# Patient Record
Sex: Female | Born: 1975 | Race: White | Hispanic: No | Marital: Married | Smoking: Current some day smoker
Health system: Southern US, Community
[De-identification: ages and names within clinical notes are randomized; demographics above are authoritative.]

## PROBLEM LIST (undated history)

## (undated) DIAGNOSIS — F32A Depression, unspecified: Secondary | ICD-10-CM

## (undated) DIAGNOSIS — F419 Anxiety disorder, unspecified: Secondary | ICD-10-CM

## (undated) HISTORY — DX: Anxiety disorder, unspecified: F41.9

---

## 1998-05-24 ENCOUNTER — Other Ambulatory Visit: Admission: RE | Admit: 1998-05-24 | Discharge: 1998-05-24 | Payer: Self-pay | Admitting: Obstetrics and Gynecology

## 1999-03-24 ENCOUNTER — Other Ambulatory Visit: Admission: RE | Admit: 1999-03-24 | Discharge: 1999-03-24 | Payer: Self-pay | Admitting: Obstetrics and Gynecology

## 1999-04-13 ENCOUNTER — Observation Stay (HOSPITAL_COMMUNITY): Admission: RE | Admit: 1999-04-13 | Discharge: 1999-04-13 | Payer: Self-pay | Admitting: Obstetrics and Gynecology

## 1999-07-27 ENCOUNTER — Inpatient Hospital Stay (HOSPITAL_COMMUNITY): Admission: AD | Admit: 1999-07-27 | Discharge: 1999-07-27 | Payer: Self-pay | Admitting: Obstetrics and Gynecology

## 1999-08-31 ENCOUNTER — Inpatient Hospital Stay (HOSPITAL_COMMUNITY): Admission: AD | Admit: 1999-08-31 | Discharge: 1999-08-31 | Payer: Self-pay | Admitting: *Deleted

## 1999-09-10 ENCOUNTER — Inpatient Hospital Stay (HOSPITAL_COMMUNITY): Admission: AD | Admit: 1999-09-10 | Discharge: 1999-09-10 | Payer: Self-pay | Admitting: Obstetrics and Gynecology

## 1999-09-15 ENCOUNTER — Inpatient Hospital Stay (HOSPITAL_COMMUNITY): Admission: AD | Admit: 1999-09-15 | Discharge: 1999-09-18 | Payer: Self-pay | Admitting: Obstetrics and Gynecology

## 1999-09-15 ENCOUNTER — Encounter (INDEPENDENT_AMBULATORY_CARE_PROVIDER_SITE_OTHER): Payer: Self-pay

## 1999-09-19 ENCOUNTER — Encounter: Admission: RE | Admit: 1999-09-19 | Discharge: 1999-12-18 | Payer: Self-pay | Admitting: Obstetrics and Gynecology

## 1999-10-19 ENCOUNTER — Other Ambulatory Visit: Admission: RE | Admit: 1999-10-19 | Discharge: 1999-10-19 | Payer: Self-pay | Admitting: Obstetrics and Gynecology

## 2002-04-07 ENCOUNTER — Other Ambulatory Visit: Admission: RE | Admit: 2002-04-07 | Discharge: 2002-04-07 | Payer: Self-pay | Admitting: Obstetrics and Gynecology

## 2002-04-30 ENCOUNTER — Ambulatory Visit (HOSPITAL_COMMUNITY): Admission: RE | Admit: 2002-04-30 | Discharge: 2002-04-30 | Payer: Self-pay | Admitting: Obstetrics and Gynecology

## 2002-05-01 ENCOUNTER — Inpatient Hospital Stay (HOSPITAL_COMMUNITY): Admission: AD | Admit: 2002-05-01 | Discharge: 2002-05-01 | Payer: Self-pay | Admitting: Family Medicine

## 2002-10-09 ENCOUNTER — Inpatient Hospital Stay (HOSPITAL_COMMUNITY): Admission: AD | Admit: 2002-10-09 | Discharge: 2002-10-09 | Payer: Self-pay | Admitting: Obstetrics and Gynecology

## 2002-10-10 ENCOUNTER — Encounter (INDEPENDENT_AMBULATORY_CARE_PROVIDER_SITE_OTHER): Payer: Self-pay | Admitting: Specialist

## 2002-10-10 ENCOUNTER — Inpatient Hospital Stay (HOSPITAL_COMMUNITY): Admission: AD | Admit: 2002-10-10 | Discharge: 2002-10-13 | Payer: Self-pay | Admitting: Obstetrics and Gynecology

## 2002-11-18 ENCOUNTER — Other Ambulatory Visit: Admission: RE | Admit: 2002-11-18 | Discharge: 2002-11-18 | Payer: Self-pay | Admitting: Obstetrics and Gynecology

## 2003-08-17 ENCOUNTER — Ambulatory Visit (HOSPITAL_COMMUNITY): Admission: RE | Admit: 2003-08-17 | Discharge: 2003-08-17 | Payer: Self-pay | Admitting: *Deleted

## 2003-08-17 ENCOUNTER — Encounter: Payer: Self-pay | Admitting: *Deleted

## 2005-12-22 ENCOUNTER — Other Ambulatory Visit: Admission: RE | Admit: 2005-12-22 | Discharge: 2005-12-22 | Payer: Self-pay | Admitting: Obstetrics and Gynecology

## 2010-11-14 ENCOUNTER — Observation Stay (HOSPITAL_COMMUNITY)
Admission: AD | Admit: 2010-11-14 | Discharge: 2010-11-16 | Payer: Self-pay | Source: Home / Self Care | Attending: Obstetrics and Gynecology | Admitting: Obstetrics and Gynecology

## 2010-12-29 ENCOUNTER — Ambulatory Visit (HOSPITAL_COMMUNITY): Admission: RE | Admit: 2010-12-29 | Payer: Self-pay | Admitting: Obstetrics and Gynecology

## 2011-02-13 LAB — URINE MICROSCOPIC-ADD ON

## 2011-02-13 LAB — CBC
Hemoglobin: 8.8 g/dL — ABNORMAL LOW (ref 12.0–15.0)
Hemoglobin: 9.9 g/dL — ABNORMAL LOW (ref 12.0–15.0)
MCH: 30.3 pg (ref 26.0–34.0)
MCH: 30.6 pg (ref 26.0–34.0)
MCHC: 34.2 g/dL (ref 30.0–36.0)
Platelets: 193 10*3/uL (ref 150–400)
Platelets: 194 10*3/uL (ref 150–400)
RBC: 3.21 MIL/uL — ABNORMAL LOW (ref 3.87–5.11)
RBC: 3.23 MIL/uL — ABNORMAL LOW (ref 3.87–5.11)
RDW: 12.3 % (ref 11.5–15.5)
WBC: 7.3 10*3/uL (ref 4.0–10.5)
WBC: 8.4 10*3/uL (ref 4.0–10.5)

## 2011-02-13 LAB — COMPREHENSIVE METABOLIC PANEL
ALT: 22 U/L (ref 0–35)
ALT: 23 U/L (ref 0–35)
AST: 22 U/L (ref 0–37)
AST: 25 U/L (ref 0–37)
AST: 27 U/L (ref 0–37)
Albumin: 2.9 g/dL — ABNORMAL LOW (ref 3.5–5.2)
Albumin: 3 g/dL — ABNORMAL LOW (ref 3.5–5.2)
Alkaline Phosphatase: 38 U/L — ABNORMAL LOW (ref 39–117)
Alkaline Phosphatase: 39 U/L (ref 39–117)
CO2: 28 mEq/L (ref 19–32)
CO2: 29 mEq/L (ref 19–32)
Calcium: 8 mg/dL — ABNORMAL LOW (ref 8.4–10.5)
Chloride: 101 mEq/L (ref 96–112)
Chloride: 104 mEq/L (ref 96–112)
Creatinine, Ser: 1.05 mg/dL (ref 0.4–1.2)
GFR calc Af Amer: 58 mL/min — ABNORMAL LOW (ref >60)
GFR calc Af Amer: >60 mL/min (ref >60)
GFR calc Af Amer: >60 mL/min (ref >60)
GFR calc non Af Amer: 48 mL/min — ABNORMAL LOW (ref >60)
GFR calc non Af Amer: 60 mL/min — ABNORMAL LOW (ref >60)
Potassium: 4.2 mEq/L (ref 3.5–5.1)
Potassium: 4.4 mEq/L (ref 3.5–5.1)
Sodium: 139 mEq/L (ref 135–145)
Sodium: 141 mEq/L (ref 135–145)
Total Bilirubin: 0.1 mg/dL — ABNORMAL LOW (ref 0.3–1.2)
Total Bilirubin: 0.3 mg/dL (ref 0.3–1.2)
Total Protein: 5.1 g/dL — ABNORMAL LOW (ref 6.0–8.3)
Total Protein: 6 g/dL (ref 6.0–8.3)

## 2011-02-13 LAB — URINALYSIS, ROUTINE W REFLEX MICROSCOPIC
Bilirubin Urine: NEGATIVE
Ketones, ur: 15 mg/dL — AB
Nitrite: NEGATIVE
pH: 7.5 (ref 5.0–8.0)

## 2011-02-13 LAB — DIFFERENTIAL
Basophils Absolute: 0 10*3/uL (ref 0.0–0.1)
Eosinophils Relative: 2 % (ref 0–5)
Lymphocytes Relative: 21 % (ref 12–46)
Monocytes Absolute: 0.6 10*3/uL (ref 0.1–1.0)
Monocytes Relative: 8 % (ref 3–12)

## 2011-02-13 LAB — URINE CULTURE
Colony Count: NO GROWTH
Culture: NO GROWTH

## 2011-04-21 NOTE — Discharge Summary (Signed)
Maria Bean, Maria NO.:  [id number]   MEDICAL RECORD NO.:  [id number]                    PATIENT TYPE:   LOCATION:                                       FACILITY:   PHYSICIAN:  Freddy Finner, M.D.                DATE OF BIRTH:   DATE OF ADMISSION:  10/10/2002  DATE OF DISCHARGE:  10/13/2002                                 DISCHARGE SUMMARY   ADMISSION DIAGNOSES:  1. Intrauterine pregnancy at 37+ weeks estimated gestational age.  2. Spontaneous rupture of membranes.  3. History of positive group B beta strep.  4. Failure to descend.  5. Multiparity, desires sterility.   DISCHARGE DIAGNOSES:  1. Status post Cesarean delivery.  2. Viable female infant.  3. Permanent sterilization.   PROCEDURE:  Primary low transverse Cesarean section and bilateral tubal  ligation.   REASON FOR ADMISSION:  Please see dictated history and physical.   HISTORY OF PRESENT ILLNESS:  The patient is a 35 year old Gravida III, Para  0, 2/0/1, who presented at [facility] at 37 weeks estimated  gestational age with spontaneous rupture of membranes. The patient had  history of positive group B beta strep. Antibiotics were administered for  prophylaxis. The patient did progress to full dilation. After pushing times  two hours with vertex remaining in minus two station, the decision was made  to proceed with a Cesarean delivery.   HOSPITAL COURSE:  The patient was taken to the OR where spinal anesthesia  was administered without difficulty. A low transverse incision was made with  the delivery of a viable female infant weighing 7 pounds, 6 ounces with  Apgar's of 8 at one minute and 9 at five minutes. Umbilical artery pH was  7.20. Bilateral tubal ligation was performed at the patient's request. The  patient tolerated the procedure well and was taken to the recovery room  in  stable condition. On postoperative day one, the patient had good return of  bowel function. Abdomen was soft. Fundus was firm and nontender. Abdominal  dressing was removed and incision was noted to be clean, dry, and intact.  Labs revealed a hemoglobin 9.3, platelet count 213,000. WBC count 14.5. On  postoperative day two, temperature was noted to be elevated to 101 degrees.  Fundus was firm but with marked tenderness noted. IV antibiotics were  administered. The patient was tolerating a regular diet without complaints  of nausea and vomiting. On postoperative day three, the patient was  afebrile. Slight erythema was noted superior to the incision on the right  margin. Staples were removed. Repeat labs revealed hemoglobin of 8.9,  platelet count of 225,000 and WBC count of 16.0. The patient was discharged  to home.   CONDITION ON DISCHARGE:  Good.   DIET:  Regular as tolerated.   ACTIVITY:  No heavy lifting. No driving times two weeks.  No vaginal entry.   FOLLOW UP:  The patient is to follow-up in the office in one to two weeks  for incision check. She is to call for temperature greater than 100 degrees,  persistent nausea and vomiting, heavy vaginal bleeding and/or redness or  drainage in the incisional site.   DISCHARGE MEDICATIONS:  1. Percocet 5/325 #30, one by mouth every four to six hours as needed pain.  2. Augmentin 500 mg, #2, to take one by mouth three times a day.  3. Niferex 150 mg one by mouth daily.  4. Prenatal vitamins one by mouth daily.  5. Motrin 600 mg over-the-counter every six hours as needed.     Julio Sicks, N.P.                        Freddy Finner, M.D.    CC/MEDQ  D:  11/13/2002  T:  11/13/2002  Job:  [id number]   cc:   Freddy Finner, M.D.  [address] [place]  [place] [address]  Fax: [phone]

## 2011-04-21 NOTE — Op Note (Signed)
NAMEMarland Bean  Maria, Bean                            ACCOUNT NO.:  [id number]   MEDICAL RECORD NO.:  [id number]                   PATIENT TYPE:  INP   LOCATION:  9126                                 FACILITY:  [facility]   PHYSICIAN:  Juluis Mire, M.D.                DATE OF BIRTH:  Jan 23, 1976   DATE OF PROCEDURE:  10/10/2002  DATE OF DISCHARGE:                                 OPERATIVE REPORT   PREOPERATIVE DIAGNOSES:  1. Intrauterine pregnancy at 37+ weeks.  2. Failure to descend.  3. Multiparity, desires sterility.   POSTOPERATIVE DIAGNOSES:  1. Intrauterine pregnancy at 37+ weeks.  2. Failure to descend.  3. Multiparity, desires sterility.   PROCEDURES:  1. Low transverse cesarean section.  2. Bilateral tubal ligation.   SURGEON:  Juluis Mire, M.D.   ANESTHESIA:  Spinal.   ESTIMATED BLOOD LOSS:  800 cc.   PACKS AND DRAINS:  None.   BLOOD REPLACED:  None.   COMPLICATIONS:  None.   INDICATION FOR PROCEDURE:  A 35 year old gravida 3, para 0-2-0-1, married  white female white female, who presents at 37-4/7 weeks with spontaneous  rupture of membranes.  She had a history of previous cervical cerclage that  was removed approximately a week ago.  Was positive group B strep.  Her past  history is significant in that with the last delivery she had a vacuum  extractor-assisted vaginal delivery of a 7 pound 9 ounce infant.  There was  a fairly significant shoulder dystocia, resulting in a fracture of a  clavicle and subsequent stay in the neonatal intensive care unit.  The  patient had presented with fairly advanced cervical dilatation.  Pushed well  for two hours, and the fetal vertex remained at rest at a -2 station with  increase in caput but actually no descent with pushing.  The decision was to  proceed with a cesarean section as opposed to attempt at operative vaginal  delivery in view of her past history.  The risks of surgery were discussed,  including the risk of  infection, the risk of hemorrhage that could  necessitate transfusion with the risk of HIV or hepatitis, the risk of  adjacent organs including bladder, bowel, or ureters that could require  further exploratory surgery, the risk of deep venous thrombosis and  pulmonary embolus.  The patient expressed an understanding of indications  and risks.   DESCRIPTION OF PROCEDURE:  The patient was taken in the OR and placed in the  supine position with a left lateral tilt.  After a satisfactory level of  spinal anesthesia was obtained, the abdomen was prepped out with Betadine  and draped as a sterile field.  A low transverse skin incision was made with  a knife and carried through subcutaneous tissue.  The anterior rectus fascia  was entered sharply and the incision in the fascia extended laterally.  The  fascia was taken off the muscle superiorly inferiorly.  The rectus muscles  were separated in the midline.  The anterior peritoneum was entered sharply  and the incision in the peritoneum extended both superiorly and inferiorly.  A low transverse bladder flap was developed.  A low transverse uterine  incision was begun with a knife and extended laterally using manual  traction.  The infant was in the vertex presentation, was delivered with  elevation and fundal pressure.  The infant was a viable female who weighed 7  pounds 6 ounces,  Apgars were 8/9, umbilical artery pH was 7.20.  There was  marked molding of the vertex, and it was in the OP presentation.  The  placenta was then delivered manually, the uterus was wiped clean of  membranes and placenta.  There was a slight inferior extension on the left  side of the uterine incision.  The uterine incision was closed with running  suture of 0 chromic using a two-layered closure technique.  The area of  extension on the left side did have some bleeding, brought under control  with interrupted figure-of-eights of 3-0 chromic.  At this point in time  we  had good hemostasis.  The patient was desirous of permanent sterilization.  The uterus had been exteriorized.  The tubes and ovaries were unremarkable.  The midsegment of each tube was grasped with Babcock tenaculum.  A hole was  made in the avascular mesosalpinx.  Individual ligatures of 0 plain catgut  were used to ligate off a segment of tube.  The intervening segment of tube  was then excised.  The cut ends of the tubes were cauterized using the  Bovie.  We had good hemostasis and complete transection of both tubes.  Revisualization of the lower uterine segment revealed some continued oozing,  brought under control with further figure-of-eights of 3-0 chromic.  However, the urine output remained clear and adequate.  The extension did  not go low enough to be concerned about the ureter at this point.  The  uterus was returned to the abdominal cavity.  We had good hemostasis.  We  thoroughly irrigated the pelvis.  Hemostasis again was excellent.  The  muscles were reapproximated with a running suture of 3-0 Vicryl, fascia  closed with a running suture of 0 PDS, skin was closed with staples and  Steri-Strips.  Sponge, instrument, and needle count were done by circulating  nurse x2.  Foley catheter remained clear at time of closure.  The patient  tolerated the procedure well, was returned to the recovery room in good  condition.                                                Juluis Mire, M.D.    JSM/MEDQ  D:  10/10/2002  T:  10/13/2002  Job:  [id number]

## 2011-04-21 NOTE — H&P (Signed)
[facility]  Patient:    Maria Bean, Maria Bean Visit Number: [id number] MRN: [id number]          Service Type: DSU Location: [facility] Attending Physician:  Trevor Iha Dictated by:   Trevor Iha, M.D. Admit Date:  04/30/2002                           History and Physical  HISTORY OF PRESENT ILLNESS:   Maria Bean is a 35 year old, G3, P2 with one living child who presents for placement of cervical cerclage. The patient has a history of incompetent cervix with a premature delivery at 22-23 weeks with the stillborn infant. 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 Her second pregnancy cervical cerclage was placed and the patient ultimately went into labor at 36 weeks and child is living and well. Her last menstrual period was January 18, 2002 given estimated date confinement on October 26, 2002. We had an ultrasound at 11 weeks which confirmed the date and normal intrauterine pregnancy with cervical length of 4 cm on ultrasound. 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  PAST MEDICAL HISTORY:         Negative.  PAST SURGICAL HISTORY:        Cerclage last pregnancy also D&C in 1996.  MEDICATIONS:                  1. Baby aspirin.                               2. Prenatal vitamins.  ALLERGIES:                    She has no known drug allergies.  LABORATORY DATA:              Blood type is B+, rubella immune, nonreactive VDRL. Hepatitis B is negative. Group B strep is positive.  PHYSICAL EXAMINATION:  VITAL SIGNS:                  Blood pressure is 124/68.  HEART:                        Regular rate and rhythm.  LUNGS:                        Clear to auscultation bilaterally.  ABDOMEN:                       Nondistended, nontender.  PELVIC:                       10-12 week size. Cervix is closed, thick and high.  IMPRESSION:                   Intrauterine pregnancy at approximately 14 weeks. History of incompetent cervix status post cerclage last pregnancy with successful pregnancy.  PLAN:                         McDonald cervical cerclage.  The risks and benefits were discussed at length which include but are not limited to risk of infection, bleeding, damage to uterus, tubes and ovaries, bowel, bladder, risk of miscarriage, possibility of the cerclage not working. The patient does give her informed consent. Dictated by:   Trevor Iha, M.D. Attending Physician:  Trevor Iha DD:  04/29/02 TD:  04/29/02 Job: [id number]

## 2011-04-21 NOTE — Op Note (Signed)
[facility]  Patient:    Maria Bean, Maria Bean Visit Number: [id number] MRN: [id number]          Service Type: DSU Location: [facility] Attending Physician:  Trevor Iha Dictated by:   Trevor Iha, M.D. Proc. Date: 04/30/02 Admit Date:  04/30/2002                             Operative Report  PREOPERATIVE DIAGNOSES:       Intrauterine pregnancy at 14 weeks, history of incompetent cervix.  POSTOPERATIVE DIAGNOSES:      Intrauterine pregnancy at 14 weeks, history of incompetent cervix.  PROCEDURE:                    Abdominal cervical cerclage.  SURGEON:                      Trevor Iha, M.D.  ANESTHESIA:                   Spinal.  INDICATIONS:                  Ms. May is a 35 year old G3, P2 with one living child.  Had incompetent cervix with the first child after having adenocarcinoma of the cervix and extensive surgery on the cervix.  She had a cerclage placed last pregnancy which resulted in a near term delivery.  She is pregnant again with an ultrasound showing a viable fetus approximately 14 weeks size.  She presents today for cervical cerclage.  Risks and benefits were discussed at length.  Informed consent was obtained.  DESCRIPTION OF PROCEDURE:     After adequate analgesia, the patient placed in the dorsal lithotomy position.  She is sterilely prepped and draped.  The bladder was sterilely drained.  Weighted speculum was placed in the posterior portion of the vagina.  A ring forceps was used to grasp the anterior portion of the cervix.  A suture of 0 Prolene was placed at the vaginal cervical fold anteriorly.  It was placed at the 12 oclock position, removed at the 10 oclock position.  Reinserted at the 8 oclock position, removed at 6 oclock. Reinserted at 6 oclock, removed at 4 oclock.  Placed at 2 oclock, removed at 12 oclock position and was tied snugly.  Left approximately 0.5 cm in length.  Minimal bleeding was  encountered.  Good placement was noted. Cervical length on pelvic examination was approximately 1.5-2 cm in length, however, by ultrasound 4 cm in length.  The cerclage was placed without difficulty and was placed, again, at the folds of the vaginal mucosa and the cervix.  The patient received 1 g of Cefotetan preoperatively.  Sponge and instrument count was normal after the procedure.  Patient was stable on transfer to the recovery room.  DISPOSITION:                  Patient will be discharged home with bed rest for the next two to three days and follow up in the office next week.  She is sent home with a routine instruction sheet. Dictated by:   Trevor Iha, M.D. Attending Physician:  Trevor Iha DD:  04/30/02 TD:  05/01/02 Job: [phone] [id number]

## 2015-05-19 ENCOUNTER — Other Ambulatory Visit: Payer: Self-pay | Admitting: Obstetrics and Gynecology

## 2015-05-20 LAB — CYTOLOGY - PAP

## 2018-08-15 ENCOUNTER — Emergency Department (HOSPITAL_COMMUNITY)
Admission: EM | Admit: 2018-08-15 | Discharge: 2018-08-15 | Disposition: A | Discharge status: Home / Self Care | Payer: Managed Care, Other (non HMO) | Attending: Emergency Medicine | Admitting: Emergency Medicine

## 2018-08-15 ENCOUNTER — Other Ambulatory Visit: Payer: Self-pay

## 2018-08-15 ENCOUNTER — Encounter (HOSPITAL_COMMUNITY): Payer: Self-pay

## 2018-08-15 ENCOUNTER — Emergency Department (HOSPITAL_COMMUNITY): Payer: Managed Care, Other (non HMO)

## 2018-08-15 DIAGNOSIS — R410 Disorientation, unspecified: Secondary | ICD-10-CM | POA: Insufficient documentation

## 2018-08-15 DIAGNOSIS — F17210 Nicotine dependence, cigarettes, uncomplicated: Secondary | ICD-10-CM | POA: Insufficient documentation

## 2018-08-15 DIAGNOSIS — R413 Other amnesia: Secondary | ICD-10-CM | POA: Diagnosis not present

## 2018-08-15 DIAGNOSIS — R638 Other symptoms and signs concerning food and fluid intake: Secondary | ICD-10-CM | POA: Diagnosis not present

## 2018-08-15 LAB — DIFFERENTIAL
Abs Immature Granulocytes: 0 10*3/uL (ref 0.0–0.1)
Basophils Absolute: 0.1 10*3/uL (ref 0.0–0.1)
Basophils Relative: 1 %
EOS ABS: 0.1 10*3/uL (ref 0.0–0.7)
EOS PCT: 1 %
Immature Granulocytes: 0 %
Lymphocytes Relative: 28 %
Lymphs Abs: 3 10*3/uL (ref 0.7–4.0)
MONOS PCT: 5 %
Monocytes Absolute: 0.6 10*3/uL (ref 0.1–1.0)
Neutro Abs: 7 10*3/uL (ref 1.7–7.7)
Neutrophils Relative %: 65 %

## 2018-08-15 LAB — I-STAT CHEM 8, ED
BUN: 6 mg/dL (ref 6–20)
CALCIUM ION: 1.15 mmol/L (ref 1.15–1.40)
CREATININE: 0.8 mg/dL (ref 0.44–1.00)
Chloride: 104 mmol/L (ref 98–111)
GLUCOSE: 128 mg/dL — AB (ref 70–99)
HEMATOCRIT: 46 % (ref 36.0–46.0)
HEMOGLOBIN: 15.6 g/dL — AB (ref 12.0–15.0)
Potassium: 4.4 mmol/L (ref 3.5–5.1)
Sodium: 140 mmol/L (ref 135–145)
TCO2: 25 mmol/L (ref 22–32)

## 2018-08-15 LAB — PROTIME-INR
INR: 1.01
PROTHROMBIN TIME: 13.2 s (ref 11.4–15.2)

## 2018-08-15 LAB — COMPREHENSIVE METABOLIC PANEL
ALT: 13 U/L (ref 0–44)
AST: 18 U/L (ref 15–41)
Albumin: 4.2 g/dL (ref 3.5–5.0)
Alkaline Phosphatase: 62 U/L (ref 38–126)
Anion gap: 11 (ref 5–15)
BILIRUBIN TOTAL: 0.6 mg/dL (ref 0.3–1.2)
BUN: 6 mg/dL (ref 6–20)
CO2: 23 mmol/L (ref 22–32)
Calcium: 9.3 mg/dL (ref 8.9–10.3)
Chloride: 105 mmol/L (ref 98–111)
Creatinine, Ser: 0.87 mg/dL (ref 0.44–1.00)
GFR calc Af Amer: >60 mL/min (ref >60)
Glucose, Bld: 130 mg/dL — ABNORMAL HIGH (ref 70–99)
POTASSIUM: 4.4 mmol/L (ref 3.5–5.1)
Sodium: 139 mmol/L (ref 135–145)
TOTAL PROTEIN: 6.5 g/dL (ref 6.5–8.1)

## 2018-08-15 LAB — CBC
HCT: 47.1 % — ABNORMAL HIGH (ref 36.0–46.0)
Hemoglobin: 15.1 g/dL — ABNORMAL HIGH (ref 12.0–15.0)
MCH: 29.1 pg (ref 26.0–34.0)
MCHC: 32.1 g/dL (ref 30.0–36.0)
MCV: 90.8 fL (ref 78.0–100.0)
PLATELETS: 234 10*3/uL (ref 150–400)
RBC: 5.19 MIL/uL — ABNORMAL HIGH (ref 3.87–5.11)
RDW: 12.3 % (ref 11.5–15.5)
WBC: 10.8 10*3/uL — ABNORMAL HIGH (ref 4.0–10.5)

## 2018-08-15 LAB — I-STAT BETA HCG BLOOD, ED (MC, WL, AP ONLY): I-stat hCG, quantitative: <5 m[IU]/mL (ref <5)

## 2018-08-15 LAB — I-STAT TROPONIN, ED: TROPONIN I, POC: 0.02 ng/mL (ref 0.00–0.08)

## 2018-08-15 LAB — APTT: aPTT: 32 seconds (ref 24–36)

## 2018-08-15 NOTE — ED Provider Notes (Signed)
MSE was initiated and I personally evaluated the patient at  2:29 PM on August 15, 2018.  The patient appears stable at this time so that the remainder of the MSE may be completed by another provider.   Patient placed in Quick Look pathway, seen and evaluated   Chief Complaint: memory problems  HPI:   Patient is a 42 yo female with hx of tobacco abuse presents with complaint of memory issues. Yesterday was a normal day for her. Today she woke up she was checking work emails and realized she did not remember sending certain emails. She called her daughter and told her she did not feel well and subsequently forgot that conversation. She had some mild lightheadedness. At this time she is feeling better, feels memory has improved but she is scared. Denies numbness, weakness, dizziness like room spinning, or headache.   ROS: + for memory issues and lightheadedness. - for numbness, weakness, dizziness, headache  Physical Exam:   Gen: No distress  Neuro: Awake and Alert  Skin: Warm    Focused Exam: CN III-XII grossly intact. 5/5 symmetric grip strength. Sensation grossly intact x 4. Negative pronator drift. Negative romberg. Ambulatory with steady gait. Normal finger to nose.   Initiation of care has begun. The patient has been counseled on the process, plan, and necessity for staying for the completion/evaluation, and the remainder of the medical screening examination. I discussed with patient  and if present family/friends the importance of alerting staff to any new or worsening symptoms or any other concerns throughout his/her ER visit.     Cherly Andersonetrucelli, Samantha R, PA-C 08/15/18 1433    Arby BarrettePfeiffer, Marcy, MD 08/20/18 [phone]

## 2018-08-15 NOTE — Discharge Instructions (Addendum)
You appear mildly dehydrated.  Follow with Dr. Paulino RilyWolters for further evaluation and treatment.

## 2018-08-15 NOTE — ED Provider Notes (Signed)
[facility] EM[facility] Provider Note   CSN: [id number] Arrival date & time: 08/15/18  1410     History   Chief Complaint Chief Complaint  Patient presents with  . Dizziness  . Memory Loss    HPI Maria Bean is a 42 y.o. female.  HPI Patient presents with a history of memory loss.  Was working on computer day and had some difficulty remembering.  States there was 2 emails that she had sent that she did not remember sending.  Some mild confusion.  Now back to normal.  Discussed with patient and her husband.  Reportedly patient has some issues at times when she does not eat much.  She did not eat much before this episode and then ate some food before EMS arrived.  After began to feel better.  At times gets some headaches and has had a dull headache today.  Has had one for the last couple days.  Has had previous headaches where she will have vision changes that would go across her peripheral vision to the middle and then back again.  States that was not going on with this headache however.  No fevers.  No difficulty walking.  Status somewhat decreased oral intake.  Has lost weight but has been attempting to.  Has previously had her thyroid checked.  No localizing numbness weakness.  States last night her vision got a little blurry in both of her eyes. History reviewed. No pertinent past medical history.  There are no active problems to display for this patient.   History reviewed. No pertinent surgical history.   OB History   None      Home Medications    Prior to Admission medications   Not on File    Family History History reviewed. No pertinent family history.  Social History Social History   Tobacco Use  . Smoking status: Current Every Day Smoker    Packs/day: 0.50    Types: Cigarettes  Substance Use Topics  . Alcohol use: Never    Frequency: Never  . Drug use: Never     Allergies   Patient has no known  allergies.   Review of Systems Review of Systems  Constitutional: Positive for appetite change.  HENT: Negative for congestion.   Eyes: Positive for visual disturbance.  Respiratory: Negative for shortness of breath.   Cardiovascular: Negative for chest pain.  Gastrointestinal: Negative for abdominal pain.  Endocrine: Negative for polyuria.  Genitourinary: Negative for menstrual problem.  Musculoskeletal: Negative for arthralgias.  Skin: Negative for rash.  Neurological: Positive for headaches. Negative for speech difficulty and light-headedness.  Hematological: Negative for adenopathy.  Psychiatric/Behavioral: Negative for confusion.     Physical Exam Updated Vital Signs BP 122/76   Pulse 66   Temp 98.2 F (36.8 C) (Oral)   Resp 14   Ht 5\' 4"  (1.626 m)   Wt 77.1 kg   SpO2 96%   BMI 29.18 kg/m   Physical Exam  Constitutional: She is oriented to person, place, and time. She appears well-developed.  Eyes: Pupils are equal, round, and reactive to light. EOM are normal.  Neck: Neck supple.  Cardiovascular: Normal rate.  Pulmonary/Chest: Effort normal.  Abdominal: Soft.  Musculoskeletal: She exhibits no tenderness.  Neurological: She is alert and oriented to person, place, and time. No cranial nerve deficit.  Skin: Skin is warm. Capillary refill takes less than 2 seconds.  Psychiatric: She has a normal mood and affect.  ED Treatments / Results  Labs (all labs ordered are listed, but only abnormal results are displayed) Labs Reviewed  CBC - Abnormal; Notable for the following components:      Result Value   WBC 10.8 (*)    RBC 5.19 (*)    Hemoglobin 15.1 (*)    HCT 47.1 (*)    All other components within normal limits  COMPREHENSIVE METABOLIC PANEL - Abnormal; Notable for the following components:   Glucose, Bld 130 (*)    All other components within normal limits  I-STAT CHEM 8, ED - Abnormal; Notable for the following components:   Glucose, Bld 128 (*)     Hemoglobin 15.6 (*)    All other components within normal limits  PROTIME-INR  APTT  DIFFERENTIAL  I-STAT TROPONIN, ED  CBG MONITORING, ED  I-STAT BETA HCG BLOOD, ED ([facility], [facility], [facility] ONLY)    EKG EKG Interpretation  Date/Time:  Thursday August 15 2018 14:19:16 EDT Ventricular Rate:  92 PR Interval:  128 QRS Duration: 88 QT Interval:  362 QTC Calculation: 447 R Axis:   52 Text Interpretation:  Normal sinus rhythm Normal ECG Confirmed by Benjiman Core [phone]) on 08/15/2018 6:57:24 PM   Radiology Ct Head Wo Contrast  Result Date: 08/15/2018 CLINICAL DATA:  Dizziness. EXAM: CT HEAD WITHOUT CONTRAST TECHNIQUE: Contiguous axial images were obtained from the base of the skull through the vertex without intravenous contrast. COMPARISON:  None. FINDINGS: Brain: No evidence of acute infarction, hemorrhage, hydrocephalus, extra-axial collection or mass lesion/mass effect. Vascular: No hyperdense vessel or unexpected calcification. Skull: Normal. Negative for fracture or focal lesion. Sinuses/Orbits: No acute finding. Other: None. IMPRESSION: Normal head CT. Electronically Signed   By: Lupita Raider, M.D.   On: 08/15/2018 15:48    Procedures Procedures (including critical care time)  Medications Ordered in ED Medications - No data to display   Initial Impression / Assessment and Plan / ED Course  I have reviewed the triage vital signs and the nursing notes.  Pertinent labs & imaging results that were available during my care of the patient were reviewed by me and considered in my medical decision making (see chart for details).     Patient with memory loss.  Back at baseline now.  Husband thinks her blood sugar may have dropped.  However she ate in the time she was evaluated sugar had come up.  Doubt this is a transient global amnesia.  However complicated migraine possibility 2.  Has had some headaches on occasion will get headaches with some vision changes.  Has had her thyroid  level checked previously.  At this point she is back at her baseline and lab work is overall reassuring.  Hemoglobin mildly elevated may have some dehydration.  Will discharge home to follow with PCP as needed.  Final Clinical Impressions(s) / ED Diagnoses   Final diagnoses:  Memory loss    ED Discharge Orders    None       Benjiman Core, MD 08/15/18 1857

## 2018-08-15 NOTE — ED Triage Notes (Addendum)
Pt from home with complaint of dizziness, memory issues. Pt states "I was sitting at my computer and I was looking at emails that I had sent but I don't remember sending them" Pt also doesn't remember what she did while working this morning. VSS. No neuro deficits. Axox4 but takes a minute to answer some questions.

## 2018-08-21 ENCOUNTER — Emergency Department (HOSPITAL_COMMUNITY): Payer: Managed Care, Other (non HMO)

## 2018-08-21 ENCOUNTER — Encounter (HOSPITAL_COMMUNITY): Payer: Self-pay | Admitting: Emergency Medicine

## 2018-08-21 ENCOUNTER — Emergency Department (HOSPITAL_COMMUNITY)
Admission: EM | Admit: 2018-08-21 | Discharge: 2018-08-21 | Disposition: A | Discharge status: Home / Self Care | Payer: Managed Care, Other (non HMO) | Attending: Emergency Medicine | Admitting: Emergency Medicine

## 2018-08-21 ENCOUNTER — Other Ambulatory Visit: Payer: Self-pay

## 2018-08-21 DIAGNOSIS — F17210 Nicotine dependence, cigarettes, uncomplicated: Secondary | ICD-10-CM | POA: Diagnosis not present

## 2018-08-21 DIAGNOSIS — R42 Dizziness and giddiness: Secondary | ICD-10-CM

## 2018-08-21 DIAGNOSIS — H539 Unspecified visual disturbance: Secondary | ICD-10-CM | POA: Diagnosis not present

## 2018-08-21 NOTE — ED Notes (Signed)
The pt has had dizziness for approx one week  She has been here and to her regular doctor  She was told to see her eye doctor who sent her back to her regular doctor and then she wss told to return here to the ed.  At present she has a sl headach with nausea.  She has a history of migraine headaches

## 2018-08-21 NOTE — ED Notes (Signed)
The pt reports that  She lost vision in both her eyes x 2 today  No vision  Problem  At present family ay bedside

## 2018-08-21 NOTE — ED Notes (Signed)
[facility]

## 2018-08-21 NOTE — ED Notes (Signed)
Returned from [facility]  Blood work cancelled

## 2018-08-21 NOTE — Discharge Instructions (Addendum)
Please read instructions below. You can take 600 mg of Advil/ibuprofen every 6 hours as needed for headache. Schedule an appointment with your primary care provider to follow up on your visit today. Schedule an appointment with a neurologist for follow up on your intermittent symptoms. Return to the ER for severe headache, new vision changes, slurred speech, facial droop, weakness or numbness, or new or concerning symptoms.

## 2018-08-21 NOTE — ED Triage Notes (Signed)
Pt reports vision changes while in the drive thru today, states that she had 2 episodes of not being able to see with restoration of vision within a few seconds pt states that she hasn't had any issues with her vision since but now feels dizzy. Reports that she has been trying to quit smoking so she is wearing a nicotine patch and has had nausea with wearing the patch before. Pt is A&Ox4 with no neurological deficits and was seen last week for stroke like work up.

## 2018-08-21 NOTE — ED Provider Notes (Signed)
[facility] Provider Note   CSN: [id number] Arrival date & time: 08/21/18  1542     History   Chief Complaint Chief Complaint  Patient presents with  . Dizziness    HPI Maria Bean is a 42 y.o. female with past medical history of hyperlipidemia, depression, anxiety, presenting to the ED with complaint of intermittent and loss that occurred at 930 this morning.  Patient states she was sitting in the drive-through when she suddenly lost her vision.  She states this lasted a few seconds and then returned about 1 minute later.  Denies any symptoms since that time.  Denies headache, unilateral limb numbness or weakness, slurred speech, facial droop.  She was seen on 08/15/2018 with complaint of memory loss.  She had a normal work-up during that visit including negative head CT.  She states since that time she has felt intermittent dizziness and lightheadedness with some headache.  Reports history of migraine with aura though states her vision loss today is different from her previous aura.  The history is provided by the patient.    History reviewed. No pertinent past medical history.  There are no active problems to display for this patient.   History reviewed. No pertinent surgical history.   OB History   None      Home Medications    Prior to Admission medications   Not on File    Family History No family history on file.  Social History Social History   Tobacco Use  . Smoking status: Current Some Day Smoker    Packs/day: 0.10    Types: Cigarettes  . Smokeless tobacco: Never Used  Substance Use Topics  . Alcohol use: Never    Frequency: Never  . Drug use: Never     Allergies   Patient has no known allergies.   Review of Systems Review of Systems  Constitutional: Negative for fever.  Eyes: Positive for visual disturbance. Negative for photophobia.  Neurological: Positive for dizziness, light-headedness and  headaches. Negative for syncope, facial asymmetry, speech difficulty, weakness and numbness.  Psychiatric/Behavioral: Negative for confusion.  All other systems reviewed and are negative.    Physical Exam Updated Vital Signs BP 140/80   Pulse 74   Temp 98.7 F (37.1 C) (Oral)   Resp 18   Ht 5\' 4"  (1.626 m)   Wt 77.1 kg   SpO2 100%   BMI 29.18 kg/m   Physical Exam  Constitutional: She appears well-developed and well-nourished. No distress.  HENT:  Head: Normocephalic and atraumatic.  Eyes: Pupils are equal, round, and reactive to light. Conjunctivae and EOM are normal.  Neck: Normal range of motion. Neck supple.  Cardiovascular: Normal rate, regular rhythm, normal heart sounds and intact distal pulses.  Pulmonary/Chest: Effort normal and breath sounds normal. No respiratory distress.  Abdominal: Soft. Bowel sounds are normal.  Neurological: She is alert.  Mental Status:  Alert, oriented, thought content appropriate, able to give a coherent history. Speech fluent without evidence of aphasia. Able to follow 2 step commands without difficulty.  Cranial Nerves:  II:  Peripheral visual fields grossly normal, pupils equal, round, reactive to light III,IV, VI: ptosis not present, extra-ocular motions intact bilaterally, 1-2 beat nystagmus that extinguishes V,VII: smile symmetric, facial light touch sensation equal VIII: hearing grossly normal to voice  X: uvula elevates symmetrically  XI: bilateral shoulder shrug symmetric and strong XII: midline tongue extension without fassiculations Motor:  Normal tone. 5/5 in upper and lower  extremities bilaterally including strong and equal grip strength and dorsiflexion/plantar flexion Sensory: Pinprick and light touch normal in all extremities.  Deep Tendon Reflexes: 2+ and symmetric in the biceps and patella Cerebellar: normal finger-to-nose with bilateral upper extremities Gait: normal gait and balance CV: distal pulses palpable  throughout    Skin: Skin is warm.  Psychiatric: She has a normal mood and affect. Her behavior is normal.  Nursing note and vitals reviewed.    ED Treatments / Results  Labs (all labs ordered are listed, but only abnormal results are displayed) Labs Reviewed - No data to display  EKG EKG Interpretation  Date/Time:  Wednesday August 21 2018 16:05:53 EDT Ventricular Rate:  94 PR Interval:  134 QRS Duration: 82 QT Interval:  364 QTC Calculation: 455 R Axis:   68 Text Interpretation:  Normal sinus rhythm Normal ECG Confirmed by Benjiman Core [phone]) on 08/21/2018 7:48:22 PM   Radiology Mr Brain Wo Contrast (neuro Protocol)  Result Date: 08/21/2018 CLINICAL DATA:  Transient vision loss. EXAM: MRI HEAD WITHOUT CONTRAST TECHNIQUE: Multiplanar, multiecho pulse sequences of the brain and surrounding structures were obtained without intravenous contrast. COMPARISON:  Head CT 08/15/2018 FINDINGS: BRAIN: There is no acute infarct, acute hemorrhage or mass effect. The midline structures are normal. There are no old infarcts. The white matter signal is normal for the patient's age. The CSF spaces are normal for age, with no hydrocephalus. Susceptibility-sensitive sequences show no chronic microhemorrhage or superficial siderosis. VASCULAR: Major intracranial arterial and venous sinus flow voids are preserved. SKULL AND UPPER CERVICAL SPINE: The visualized skull base, calvarium, upper cervical spine and extracranial soft tissues are normal. SINUSES/ORBITS: No fluid levels or advanced mucosal thickening. No mastoid or middle ear effusion. The orbits are normal. IMPRESSION: Normal brain MRI. Electronically Signed   By: Deatra Keari Miu M.D.   On: 08/21/2018 20:47    Procedures Procedures (including critical care time)  Medications Ordered in ED Medications - No data to display   Initial Impression / Assessment and Plan / ED Course  I have reviewed the triage vital signs and the nursing  notes.  Pertinent labs & imaging results that were available during my care of the patient were reviewed by me and considered in my medical decision making (see chart for details).     Patient presenting to the ED with complaint of brief episodes of vision loss that occurred around 9:30 AM this morning.  Associated headache with some intermittent lightheadedness that is been ongoing.  No other neurologic symptoms.  On exam, no focal neuro deficits.  Given patient's recent visit with negative head CT and new symptoms, MRI was ordered to rule out acute infarct.  MRI is normal.  Suspect complicated migraine given patient's history of migraine with aura.  She is instructed to follow-up with neurology, referral provided.  Asymptomatic throughout ED stay.  Safe for discharge.  Patient discussed with Dr. Rubin Payor, who guided treatment and agrees with care plan for discharge with follow-up.  Discussed results, findings, treatment and follow up. Patient advised of return precautions. Patient verbalized understanding and agreed with plan.   Final Clinical Impressions(s) / ED Diagnoses   Final diagnoses:  Dizziness  Transient vision disturbance    ED Discharge Orders    None       Orman Matsumura, [place] N, PA-C 08/21/18 2118    Benjiman Core, MD 08/21/18 2229

## 2019-08-27 IMAGING — MR MR HEAD W/O CM
10 of 11 series · 43 of 48 positions shown · non-contrast
Comparison: Head CT 08/15/2018

CLINICAL DATA: Transient vision loss.

EXAM:
MRI HEAD WITHOUT CONTRAST
TECHNIQUE: Multiplanar, multiecho pulse sequences of the brain and surrounding
structures were obtained without intravenous contrast.

[Series 5: DWI · axial · 4.0mm · 0.88mm/px · z∈[-81,+57]mm · 9 of 74 slices shown (1 of 4)]
[im 1/74]
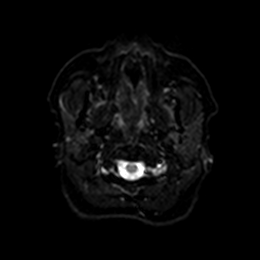
[im 10/74]
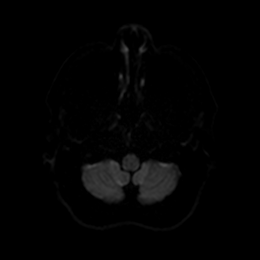
[im 19/74]
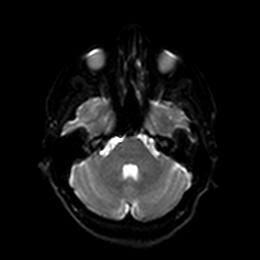
[im 28/74]
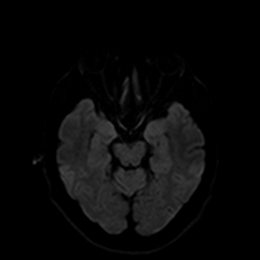
[im 37/74]
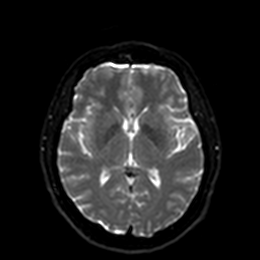
[im 46/74]
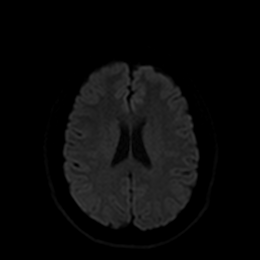
[im 55/74]
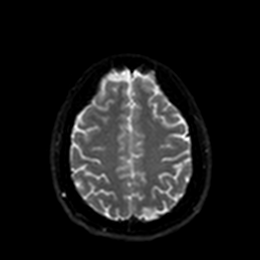
[im 64/74]
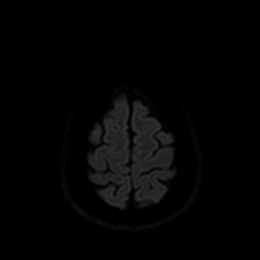
[im 74/74]
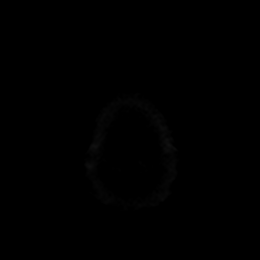

[Series 6: DWI · axial · 4.0mm · 0.88mm/px · z∈[-81,+57]mm · 4 of 37 slices shown (2 of 4)]
[im 1/37]
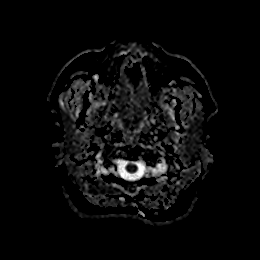
[im 13/37]
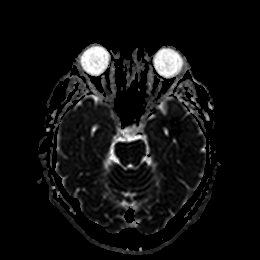
[im 25/37]
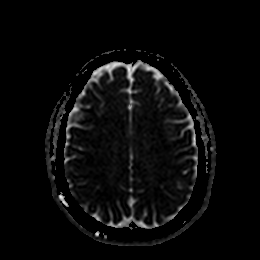
[im 37/37]
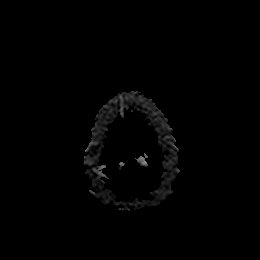

[Series 7: FLAIR · axial · 5.0mm · 0.45mm/px · z∈[-82,+57]mm · 3 of 25 slices shown]
[im 1/25]
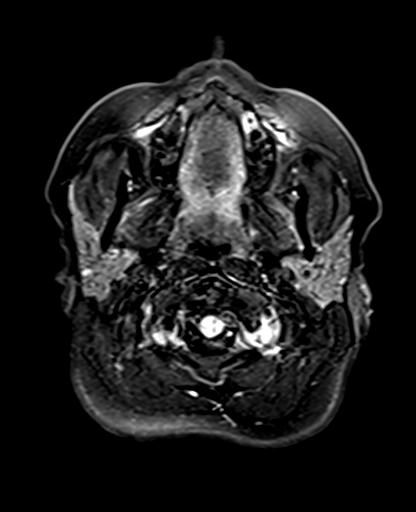
[im 13/25]
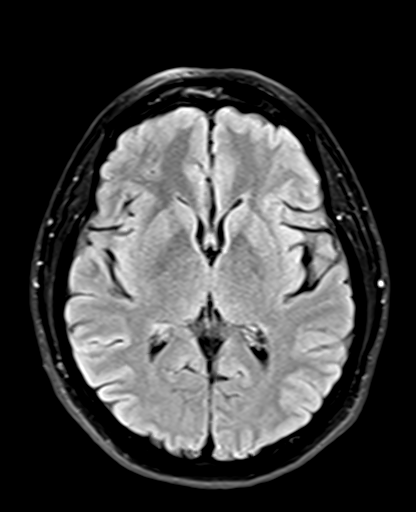
[im 25/25]
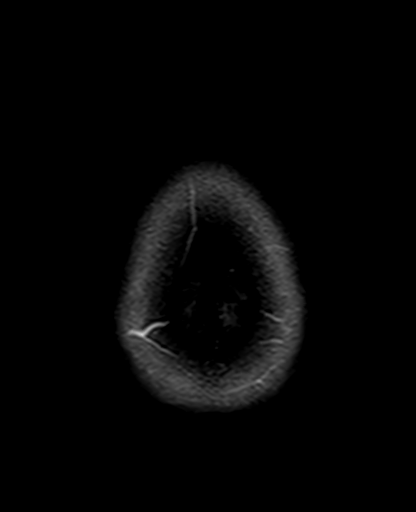

[Series 8: T2 · axial · 5.0mm · 0.72mm/px · z∈[-81,+57]mm · 3 of 25 slices shown (1 of 2)]
[im 1/25]
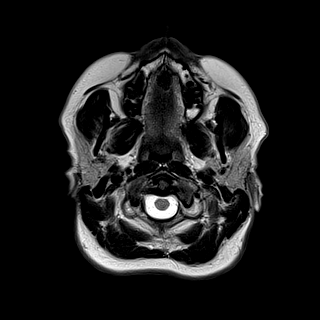
[im 13/25]
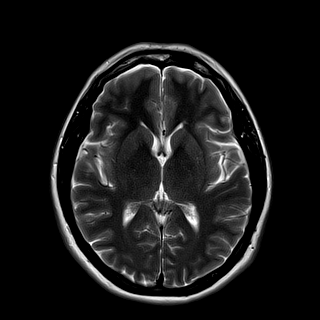
[im 25/25]
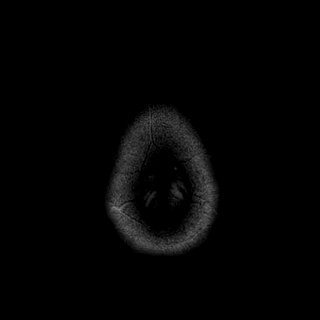

[Series 9: swi_images · axial · 3.0mm · 0.90mm/px · z∈[-80,+56]mm · 5 of 48 slices shown]
[im 1/48]
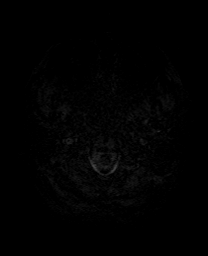
[im 12/48]
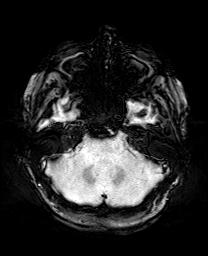
[im 24/48]
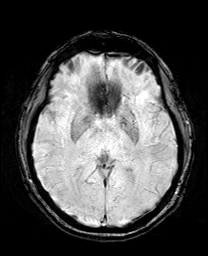
[im 36/48]
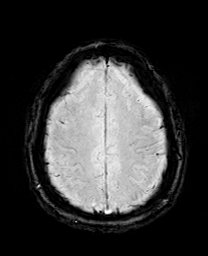
[im 48/48]
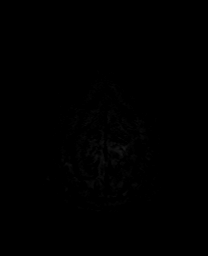

[Series 10: mip_images(sw) · axial · 24.0mm · 0.90mm/px · z∈[-70,+45]mm · 4 of 41 slices shown]
[im 1/41]
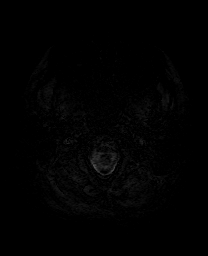
[im 14/41]
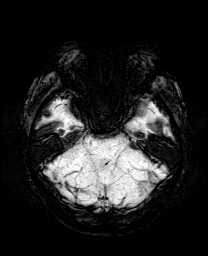
[im 27/41]
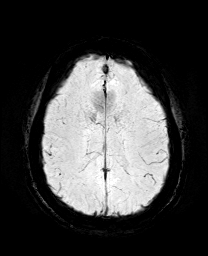
[im 41/41]
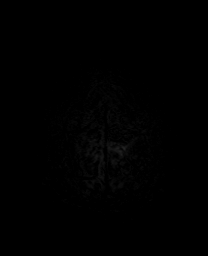

[Series 11: DWI · coronal · 4.0mm · 0.88mm/px · 7 of 64 slices shown (3 of 4)]
[im 1/64]
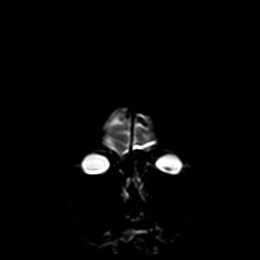
[im 11/64]
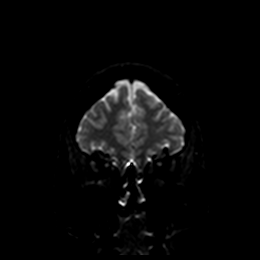
[im 22/64]
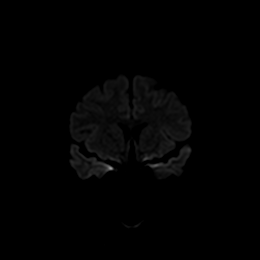
[im 32/64]
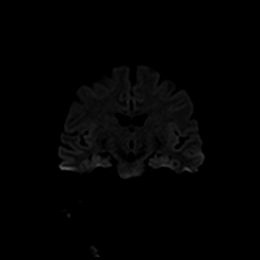
[im 43/64]
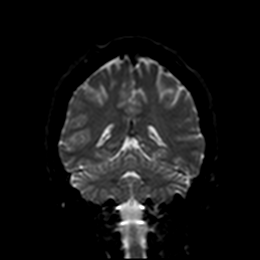
[im 53/64]
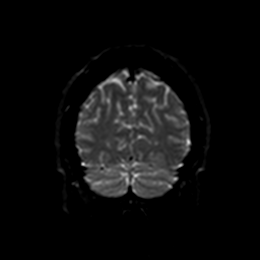
[im 64/64]
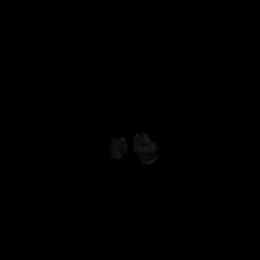

[Series 12: DWI · coronal · 4.0mm · 0.88mm/px · 3 of 32 slices shown (4 of 4)]
[im 1/32]
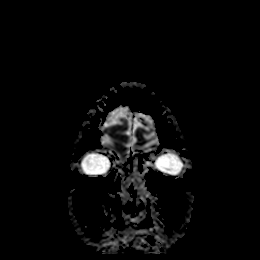
[im 16/32]
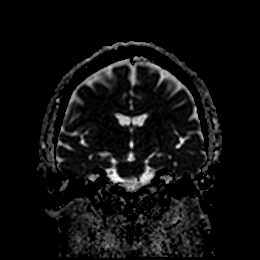
[im 32/32]
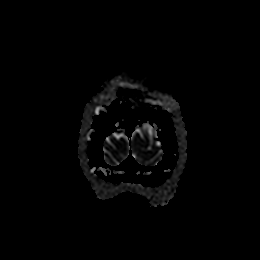

[Series 13: T1 · sagittal · 5.0mm · 0.75mm/px · 2 of 23 slices shown]
[im 1/23]
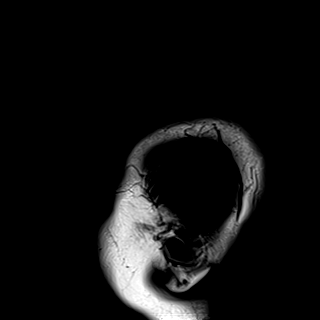
[im 23/23]
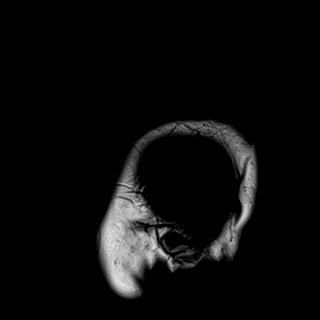

[Series 15: T2 · coronal · 5.0mm · 0.34mm/px · 3 of 26 slices shown (2 of 2)]
[im 1/26]
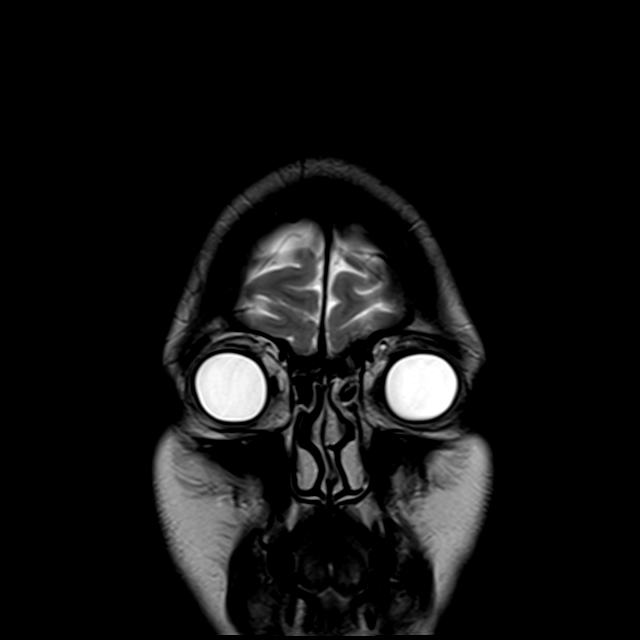
[im 13/26]
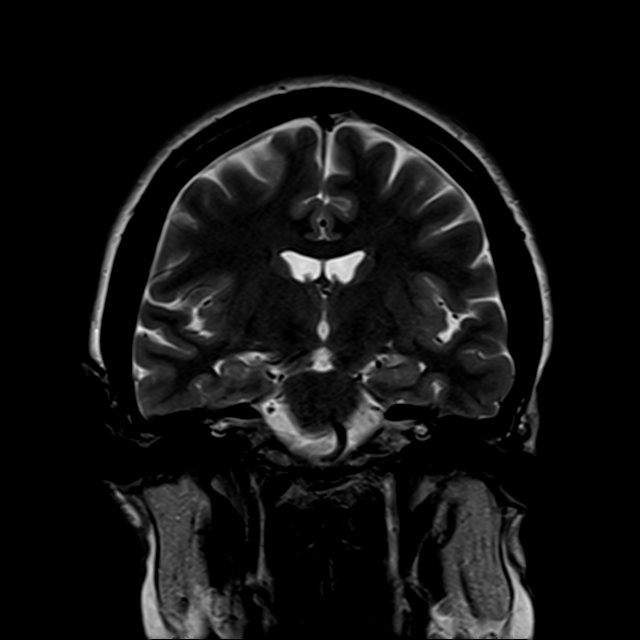
[im 26/26]
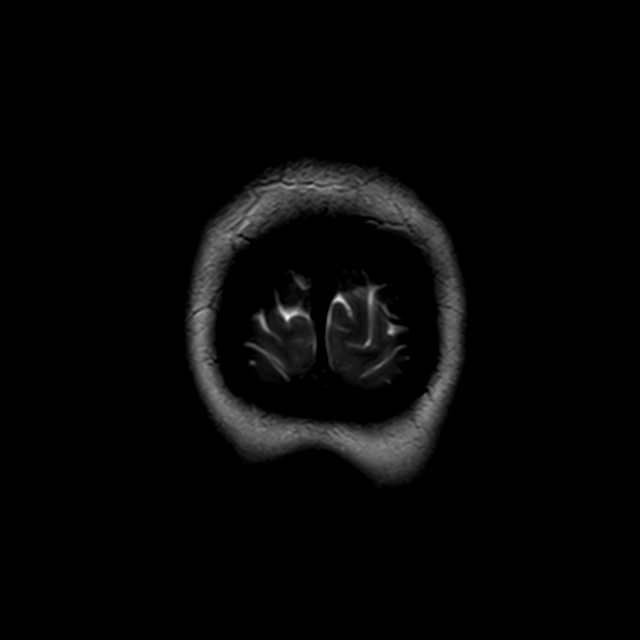

[43 of 48 positions shown; findings below may reference images not displayed]

FINDINGS: BRAIN: There is no acute infarct, acute hemorrhage or mass effect.
The midline structures are normal. There are no old infarcts. The
white matter signal is normal for the patient's age. The CSF spaces
are normal for age, with no hydrocephalus. Susceptibility-sensitive
sequences show no chronic microhemorrhage or superficial siderosis.

VASCULAR: Major intracranial arterial and venous sinus flow voids
are preserved.

SKULL AND UPPER CERVICAL SPINE: The visualized skull base,
calvarium, upper cervical spine and extracranial soft tissues are
normal.

SINUSES/ORBITS: No fluid levels or advanced mucosal thickening. No
mastoid or middle ear effusion. The orbits are normal.
IMPRESSION: Normal brain MRI.

## 2019-09-12 ENCOUNTER — Other Ambulatory Visit: Payer: Self-pay

## 2019-09-12 DIAGNOSIS — Z20822 Contact with and (suspected) exposure to covid-19: Secondary | ICD-10-CM

## 2019-09-14 LAB — NOVEL CORONAVIRUS, NAA: SARS-CoV-2, NAA: NOT DETECTED

## 2020-06-17 ENCOUNTER — Encounter: Payer: Self-pay | Admitting: Emergency Medicine

## 2020-06-17 ENCOUNTER — Other Ambulatory Visit: Payer: Self-pay

## 2020-06-17 ENCOUNTER — Ambulatory Visit
Admission: EM | Admit: 2020-06-17 | Discharge: 2020-06-17 | Disposition: A | Discharge status: Home / Self Care | Payer: Managed Care, Other (non HMO)

## 2020-06-17 DIAGNOSIS — M79661 Pain in right lower leg: Secondary | ICD-10-CM

## 2020-06-17 DIAGNOSIS — S86811A Strain of other muscle(s) and tendon(s) at lower leg level, right leg, initial encounter: Secondary | ICD-10-CM

## 2020-06-17 HISTORY — DX: Depression, unspecified: F32.A

## 2020-06-17 MED ORDER — PREDNISONE 20 MG PO TABS: 20.0000 mg | ORAL_TABLET | Freq: Two times a day (BID) | ORAL | 0 refills | Status: AC

## 2020-06-17 NOTE — ED Triage Notes (Signed)
RT calf pain x 3 weeks, dosent recall any injuries at that time.  On Saturday she heard something pop in that area when walking. Yesterday she was running after a dog and had more intense pain in that area.

## 2020-06-17 NOTE — ED Provider Notes (Signed)
[facility] CARE CENTER   [id number] 06/17/20 Arrival Time: 1544  CC: RLE pain  SUBJECTIVE: History from: patient. Maria Bean is a 44 y.o. female complains of RLE pain x 3 weeks.  Denies a precipitating event or specific injury.  Worsening symptoms over the past 3-4 days.  Felt pop in RLE over the weekend and sharp pain while running after a dog.  Localizes the pain to the inside of the RLE, just below the knee joint.  Describes the pain as intermittent and sharp in character.  Has tried OTC medications without relief.  Symptoms are made worse with walking down steps.  Denies similar symptoms in the past.  Complains of some swelling as well.  Denies fever, chills, erythema, ecchymosis, weakness, numbness and tingling.    ROS: As per HPI.  All other pertinent ROS negative.     Past Medical History:  Diagnosis Date  . Anxiety and depression    No past surgical history on file. No Known Allergies No current facility-administered medications on file prior to encounter.   Current Outpatient Medications on File Prior to Encounter  Medication Sig Dispense Refill  . buPROPion (WELLBUTRIN XL) 150 MG 24 hr tablet Take 150 mg by mouth daily.    . DULoxetine (CYMBALTA) 60 MG capsule Take 60 mg by mouth 2 (two) times daily.    Marland Kitchen lamoTRIgine (LAMICTAL) 100 MG tablet Take 100 mg by mouth daily.     Social History   Socioeconomic History  . Marital status: Married    Spouse name: Not on file  . Number of children: Not on file  . Years of education: Not on file  . Highest education level: Not on file  Occupational History  . Not on file  Tobacco Use  . Smoking status: Current Some Day Smoker    Packs/day: 0.10    Types: Cigarettes  . Smokeless tobacco: Never Used  Vaping Use  . Vaping Use: Never assessed  Substance and Sexual Activity  . Alcohol use: Never  . Drug use: Never  . Sexual activity: Not Currently  Other Topics Concern  . Not on file  Social History Narrative  . Not  on file   Social Determinants of Health   Financial Resource Strain:   . Difficulty of Paying Living Expenses:   Food Insecurity:   . Worried About Programme researcher, broadcasting/film/video in the Last Year:   . Barista in the Last Year:   Transportation Needs:   . Freight forwarder (Medical):   Marland Kitchen Lack of Transportation (Non-Medical):   Physical Activity:   . Days of Exercise per Week:   . Minutes of Exercise per Session:   Stress:   . Feeling of Stress :   Social Connections:   . Frequency of Communication with Friends and Family:   . Frequency of Social Gatherings with Friends and Family:   . Attends Religious Services:   . Active Member of Clubs or Organizations:   . Attends Banker Meetings:   Marland Kitchen Marital Status:   Intimate Partner Violence:   . Fear of Current or Ex-Partner:   . Emotionally Abused:   Marland Kitchen Physically Abused:   . Sexually Abused:    No family history on file.  OBJECTIVE:  Vitals:   06/17/20 1559 06/17/20 1602  BP:  122/81  Pulse:  78  Resp:  17  Temp:  99 F (37.2 C)  TempSrc:  Oral  SpO2:  95%  Weight: 169 lb  12.1 oz (77 kg)   Height: 5\' 4"  (1.626 m)     General appearance: ALERT; in no acute distress.  Head: NCAT Lungs: Normal respiratory effort CV: Dorsalis pedis pulse 2+ Musculoskeletal: RLE Inspection: Skin warm, dry, clear and intact without obvious erythema, effusion, or ecchymosis.  Palpation: TTP over proximal medial RLE ROM: FROM active and passive Strength: 5/5 dorsiflexion, 5/5 plantar flexion Skin: warm and dry Neurologic: Ambulates without difficulty; Sensation intact about the lower extremities Psychological: alert and cooperative; normal mood and affect   ASSESSMENT & PLAN:  1. Right calf pain   2. Strain of right calf muscle    Meds ordered this encounter  Medications  . predniSONE (DELTASONE) 20 MG tablet    Sig: Take 1 tablet (20 mg total) by mouth 2 (two) times daily with a meal for 5 days.    Dispense:  10  tablet    Refill:  0    Order Specific Question:   Supervising Provider    Answer:   Eustace Moore    Continue conservative management of rest, ice, and gentle stretches Take prednisone as prescribed and to completion Follow up with PCP for further evaluation and management Return or go to the ER if you have any new or worsening symptoms (fever, chills, chest pain, redness, swelling, bruising, deformity, etc...)   Reviewed expectations re: course of current medical issues. Questions answered. Outlined signs and symptoms indicating need for more acute intervention. Patient verbalized understanding. After Visit Summary given.    [[id number]], PA-C 06/17/20 1617

## 2020-06-17 NOTE — Discharge Instructions (Signed)
Continue conservative management of rest, ice, and gentle stretches Take prednisone as prescribed and to completion Follow up with PCP for further evaluation and management Return or go to the ER if you have any new or worsening symptoms (fever, chills, chest pain, redness, swelling, bruising, deformity, etc...)

## 2020-07-18 ENCOUNTER — Ambulatory Visit
Admission: EM | Admit: 2020-07-18 | Discharge: 2020-07-18 | Disposition: A | Discharge status: Home / Self Care | Payer: Managed Care, Other (non HMO) | Attending: Emergency Medicine | Admitting: Emergency Medicine

## 2020-07-18 ENCOUNTER — Encounter: Payer: Self-pay | Admitting: Emergency Medicine

## 2020-07-18 ENCOUNTER — Other Ambulatory Visit: Payer: Self-pay

## 2020-07-18 DIAGNOSIS — G43009 Migraine without aura, not intractable, without status migrainosus: Secondary | ICD-10-CM | POA: Diagnosis not present

## 2020-07-18 DIAGNOSIS — H6592 Unspecified nonsuppurative otitis media, left ear: Secondary | ICD-10-CM

## 2020-07-18 MED ORDER — FLUTICASONE PROPIONATE 50 MCG/ACT NA SUSP: 1.0000 | Freq: Every day | NASAL | 0 refills | Status: AC

## 2020-07-18 MED ORDER — ONDANSETRON 4 MG PO TBDP
4.0000 mg | ORAL_TABLET | Freq: Once | ORAL | Status: AC
  Administered 2020-07-18: 4 mg via ORAL

## 2020-07-18 MED ORDER — SUMATRIPTAN SUCCINATE 25 MG PO TABS: 25.0000 mg | ORAL_TABLET | ORAL | 0 refills | Status: AC | PRN

## 2020-07-18 MED ORDER — KETOROLAC TROMETHAMINE 60 MG/2ML IM SOLN
60.0000 mg | Freq: Once | INTRAMUSCULAR | Status: AC
  Administered 2020-07-18: 60 mg via INTRAMUSCULAR

## 2020-07-18 MED ORDER — DEXAMETHASONE SODIUM PHOSPHATE 10 MG/ML IJ SOLN
10.0000 mg | Freq: Once | INTRAMUSCULAR | Status: AC
  Administered 2020-07-18: 10 mg via INTRAMUSCULAR

## 2020-07-18 NOTE — Discharge Instructions (Addendum)
Migraine cocktail given in office Rest and drink plenty of fluids Use OTC medications as needed for symptomatic relief Follow up with PCP if symptoms persists Return or go to the ER if you have any new or worsening symptoms such as fever, chills, nausea, vomiting, chest pain, shortness of breath, cough, vision changes, worsening headache despite treatment, slurred speech, facial asymmetry, weakness in arms or legs, etc... 

## 2020-07-18 NOTE — ED Triage Notes (Signed)
Headache x3-4 days, worse the past 2 days.  Nausea and light sensitivity. Constant pain to temples and back of head

## 2020-07-18 NOTE — ED Provider Notes (Signed)
[facility] CARE CENTER   [id number] 07/18/20 Arrival Time: 0907  OA:[id number]  SUBJECTIVE:  KEILYNN MARANO is a 44 y.o. female who presented to the urgent care for complaint of headache for the past 4 days.  Reported headache has been getting worse the past 2.  Patient localizes her pain to the generalized head.  Describes the pain as constant and throbbing in character.  Patient has tried OTC medication without relief. Symptoms are made worse with light.  Reports similar symptoms in the past that improved with Imitrex.  This is not the worst headache of their life.  Patient denies fever, chills, nausea, vomiting, aura, rhinorrhea, watery eyes, chest pain, SOB, abdominal pain, weakness, numbness or tingling, slurred speech.     ROS: As per HPI.  All other pertinent ROS negative.     Past Medical History:  Diagnosis Date  . Anxiety and depression    History reviewed. No pertinent surgical history. No Known Allergies No current facility-administered medications on file prior to encounter.   Current Outpatient Medications on File Prior to Encounter  Medication Sig Dispense Refill  . buPROPion (WELLBUTRIN XL) 150 MG 24 hr tablet Take 150 mg by mouth daily.    . DULoxetine (CYMBALTA) 60 MG capsule Take 60 mg by mouth 2 (two) times daily.    Marland Kitchen lamoTRIgine (LAMICTAL) 100 MG tablet Take 100 mg by mouth daily.     Social History   Socioeconomic History  . Marital status: Married    Spouse name: Not on file  . Number of children: Not on file  . Years of education: Not on file  . Highest education level: Not on file  Occupational History  . Not on file  Tobacco Use  . Smoking status: Current Some Day Smoker    Packs/day: 0.10    Types: Cigarettes  . Smokeless tobacco: Never Used  Vaping Use  . Vaping Use: Never assessed  Substance and Sexual Activity  . Alcohol use: Never  . Drug use: Never  . Sexual activity: Not Currently  Other Topics Concern  . Not on file  Social  History Narrative  . Not on file   Social Determinants of Health   Financial Resource Strain:   . Difficulty of Paying Living Expenses:   Food Insecurity:   . Worried About Programme researcher, broadcasting/film/video in the Last Year:   . Barista in the Last Year:   Transportation Needs:   . Freight forwarder (Medical):   Marland Kitchen Lack of Transportation (Non-Medical):   Physical Activity:   . Days of Exercise per Week:   . Minutes of Exercise per Session:   Stress:   . Feeling of Stress :   Social Connections:   . Frequency of Communication with Friends and Family:   . Frequency of Social Gatherings with Friends and Family:   . Attends Religious Services:   . Active Member of Clubs or Organizations:   . Attends Banker Meetings:   Marland Kitchen Marital Status:   Intimate Partner Violence:   . Fear of Current or Ex-Partner:   . Emotionally Abused:   Marland Kitchen Physically Abused:   . Sexually Abused:    No family history on file.  OBJECTIVE:  Vitals:   07/18/20 0925 07/18/20 0931  BP: (!) 145/91   Pulse: 75   Resp: 16   Temp: 98.9 F (37.2 C)   TempSrc: Oral   SpO2: 97%   Weight:  169 lb 12.1 oz (77 kg)  Height:  5\' 4"  (1.626 m)    General appearance: alert; no distress Eyes: PERRLA; EOMI HENT: normocephalic; atraumatic, left middle ear effusion present Neck: supple with FROM Lungs: clear to auscultation bilaterally Heart: regular rate and rhythm.  Radial pulses 2+ symmetrical bilaterally Extremities: no edema; symmetrical with no gross deformities Skin: warm and dry Neurologic: CN 2-12 grossly intact; finger to nose without difficulty; normal gait; strength and sensation intact bilaterally about the upper and lower extremities; negative pronator drift Psychological: alert and cooperative; normal mood and affect   ASSESSMENT & PLAN:  1. Migraine without aura and without status migrainosus, not intractable   2. Middle ear effusion, left     Meds ordered this encounter    Medications  . ondansetron (ZOFRAN-ODT) disintegrating tablet 4 mg  . ketorolac (TORADOL) injection 60 mg  . dexamethasone (DECADRON) injection 10 mg  . SUMAtriptan (IMITREX) 25 MG tablet    Sig: Take 1 tablet (25 mg total) by mouth every 2 (two) hours as needed for migraine. May repeat in 2 hours if headache persists or recurs. Max dose 200 mg/ 24h    Dispense:  15 tablet    Refill:  0  . fluticasone (FLONASE) 50 MCG/ACT nasal spray    Sig: Place 1 spray into both nostrils daily for 14 days.    Dispense:  16 g    Refill:  0   Discharge instructions Migraine cocktail given in office Rest and drink plenty of fluids Use OTC medications as needed for symptomatic relief Follow up with PCP if symptoms persists Return or go to the ER if you have any new or worsening symptoms such as fever, chills, nausea, vomiting, chest pain, shortness of breath, cough, vision changes, worsening headache despite treatment, slurred speech, facial asymmetry, weakness in arms or legs, etc...  Reviewed expectations re: course of current medical issues. Questions answered. Outlined signs and symptoms indicating need for more acute intervention. Patient verbalized understanding. After Visit Summary given.   , FNP 07/18/20 1013

## 2020-08-06 ENCOUNTER — Encounter: Payer: Self-pay | Admitting: Neurology

## 2020-09-01 ENCOUNTER — Ambulatory Visit: Payer: Managed Care, Other (non HMO) | Admitting: Neurology

## 2020-10-25 ENCOUNTER — Ambulatory Visit: Payer: Managed Care, Other (non HMO) | Admitting: Neurology

## 2023-06-15 ENCOUNTER — Other Ambulatory Visit: Payer: Self-pay | Admitting: Family Medicine

## 2023-06-15 DIAGNOSIS — N644 Mastodynia: Secondary | ICD-10-CM

## 2023-07-18 ENCOUNTER — Ambulatory Visit
Admission: RE | Admit: 2023-07-18 | Discharge: 2023-07-18 | Disposition: A | Discharge status: Home / Self Care | Payer: Managed Care, Other (non HMO) | Source: Ambulatory Visit | Attending: Family Medicine | Admitting: Family Medicine

## 2023-07-18 ENCOUNTER — Ambulatory Visit: Admission: RE | Admit: 2023-07-18 | Payer: 59 | Source: Ambulatory Visit

## 2023-07-18 DIAGNOSIS — N644 Mastodynia: Secondary | ICD-10-CM

## 2024-01-27 ENCOUNTER — Ambulatory Visit
Admission: EM | Admit: 2024-01-27 | Discharge: 2024-01-27 | Disposition: A | Discharge status: Home / Self Care | Payer: 59 | Attending: Family Medicine | Admitting: Family Medicine

## 2024-01-27 DIAGNOSIS — J329 Chronic sinusitis, unspecified: Secondary | ICD-10-CM | POA: Diagnosis not present

## 2024-01-27 DIAGNOSIS — B9789 Other viral agents as the cause of diseases classified elsewhere: Secondary | ICD-10-CM | POA: Diagnosis not present

## 2024-01-27 MED ORDER — PREDNISONE 20 MG PO TABS: 40.0000 mg | ORAL_TABLET | Freq: Every day | ORAL | 0 refills | Status: AC

## 2024-01-27 MED ORDER — PROMETHAZINE-DM 6.25-15 MG/5ML PO SYRP: 5.0000 mL | ORAL_SOLUTION | Freq: Four times a day (QID) | ORAL | 0 refills | Status: AC | PRN

## 2024-01-27 NOTE — ED Triage Notes (Signed)
 Pt reports head and chest congestion, cough, feverish feeling, right ear pain, pt states she has had sx's x 1 week.

## 2024-01-27 NOTE — ED Provider Notes (Signed)
 [facility]    CSN: [id number] Arrival date & time: 01/27/24  1327      History   Chief Complaint No chief complaint on file.   HPI Maria Bean is a 48 y.o. female.   Presenting today with about a week of ongoing congestion, sinus pressure, productive cough, intermittent fevers feeling, right ear pressure.  Denies chest pain, shortness of breath, abdominal pain, vomiting, diarrhea.  So far trying numerous over-the-counter cold and congestion medication with minimal relief.    Past Medical History:  Diagnosis Date   Anxiety and depression     There are no active problems to display for this patient.   History reviewed. No pertinent surgical history.  OB History   No obstetric history on file.      Home Medications    Prior to Admission medications   Medication Sig Start Date End Date Taking? Authorizing Provider  predniSONE (DELTASONE) 20 MG tablet Take 2 tablets (40 mg total) by mouth daily with breakfast. 01/27/24  Yes Particia Nearing, PA-C  promethazine-dextromethorphan (PROMETHAZINE-DM) 6.25-15 MG/5ML syrup Take 5 mLs by mouth 4 (four) times daily as needed. 01/27/24  Yes Particia Nearing, PA-C  buPROPion (WELLBUTRIN XL) 150 MG 24 hr tablet Take 150 mg by mouth daily.    [provider]  DULoxetine (CYMBALTA) 60 MG capsule Take 60 mg by mouth 2 (two) times daily.    [provider]  fluticasone (FLONASE) 50 MCG/ACT nasal spray Place 1 spray into both nostrils daily for 14 days. 07/18/20 08/01/20  Avegno, Zachery Dakins, FNP  lamoTRIgine (LAMICTAL) 100 MG tablet Take 100 mg by mouth daily.    [provider]  SUMAtriptan (IMITREX) 25 MG tablet Take 1 tablet (25 mg total) by mouth every 2 (two) hours as needed for migraine. May repeat in 2 hours if headache persists or recurs. Max dose 200 mg/ 24h 07/18/20   Avegno, Zachery Dakins, FNP    Family History History reviewed. No pertinent family history.  Social  History Social History   Tobacco Use   Smoking status: Some Days    Current packs/day: 0.10    Types: Cigarettes   Smokeless tobacco: Never  Substance Use Topics   Alcohol use: Never   Drug use: Never     Allergies   Atorvastatin calcium   Review of Systems Review of Systems Per HPI  Physical Exam Triage Vital Signs ED Triage Vitals  Encounter Vitals Group     BP 01/27/24 1424 129/89     Systolic BP Percentile --      Diastolic BP Percentile --      Pulse Rate 01/27/24 1424 86     Resp 01/27/24 1424 20     Temp 01/27/24 1424 98.1 F (36.7 C)     Temp src --      SpO2 01/27/24 1424 95 %     Weight --      Height --      Head Circumference --      Peak Flow --      Pain Score 01/27/24 1427 0     Pain Loc --      Pain Education --      Exclude from Growth Chart --    No data found.  Updated Vital Signs BP 129/89 (BP Location: Right Arm)   Pulse 86   Temp 98.1 F (36.7 C)   Resp 20   SpO2 95%   Visual Acuity Right Eye Distance:  Left Eye Distance:   Bilateral Distance:    Right Eye Near:   Left Eye Near:    Bilateral Near:     Physical Exam Vitals and nursing note reviewed.  Constitutional:      Appearance: Normal appearance.  HENT:     Head: Atraumatic.     Right Ear: External ear normal.     Left Ear: Tympanic membrane and external ear normal.     Nose: Rhinorrhea present.     Mouth/Throat:     Mouth: Mucous membranes are moist.     Pharynx: Posterior oropharyngeal erythema present.  Eyes:     Extraocular Movements: Extraocular movements intact.     Conjunctiva/sclera: Conjunctivae normal.  Cardiovascular:     Rate and Rhythm: Normal rate and regular rhythm.     Heart sounds: Normal heart sounds.  Pulmonary:     Effort: Pulmonary effort is normal.     Breath sounds: Normal breath sounds. No wheezing or rales.  Musculoskeletal:        General: Normal range of motion.     Cervical back: Normal range of motion and neck supple.   Skin:    General: Skin is warm and dry.  Neurological:     Mental Status: She is alert and oriented to person, place, and time.  Psychiatric:        Mood and Affect: Mood normal.        Thought Content: Thought content normal.      UC Treatments / Results  Labs (all labs ordered are listed, but only abnormal results are displayed) Labs Reviewed - No data to display  EKG   Radiology No results found.  Procedures Procedures (including critical care time)  Medications Ordered in UC Medications - No data to display  Initial Impression / Assessment and Plan / UC Course  I have reviewed the triage vital signs and the nursing notes.  Pertinent labs & imaging results that were available during my care of the patient were reviewed by me and considered in my medical decision making (see chart for details).     Persisting viral respiratory infection.  Will treat with prednisone, Phenergan DM, supportive over-the-counter medications and home care.  Final Clinical Impressions(s) / UC Diagnoses   Final diagnoses:  Viral sinusitis   Discharge Instructions   None    ED Prescriptions     Medication Sig Dispense Auth. Provider   predniSONE (DELTASONE) 20 MG tablet Take 2 tablets (40 mg total) by mouth daily with breakfast. 10 tablet Particia Nearing, PA-C   promethazine-dextromethorphan (PROMETHAZINE-DM) 6.25-15 MG/5ML syrup Take 5 mLs by mouth 4 (four) times daily as needed. 100 mL Particia Nearing, [place]      PDMP not reviewed this encounter.   Particia Nearing, [place] 01/27/24 1521

## 2024-10-10 ENCOUNTER — Other Ambulatory Visit (HOSPITAL_COMMUNITY): Payer: Self-pay | Admitting: Family Medicine

## 2024-10-10 DIAGNOSIS — E7800 Pure hypercholesterolemia, unspecified: Secondary | ICD-10-CM

## 2024-10-17 ENCOUNTER — Ambulatory Visit (HOSPITAL_COMMUNITY)
Admission: RE | Admit: 2024-10-17 | Discharge: 2024-10-17 | Disposition: A | Discharge status: Home / Self Care | Payer: Self-pay | Source: Ambulatory Visit | Attending: Family Medicine | Admitting: Family Medicine

## 2024-10-17 DIAGNOSIS — E7800 Pure hypercholesterolemia, unspecified: Secondary | ICD-10-CM
